# Patient Record
Sex: Male | Born: 1995 | Race: White | Hispanic: No | Marital: Single | Smoking: Current every day smoker
Health system: Southern US, Community
[De-identification: ages and names within clinical notes are randomized; demographics above are authoritative.]

## PROBLEM LIST (undated history)

## (undated) DIAGNOSIS — R636 Underweight: Secondary | ICD-10-CM

## (undated) DIAGNOSIS — F988 Other specified behavioral and emotional disorders with onset usually occurring in childhood and adolescence: Secondary | ICD-10-CM

## (undated) DIAGNOSIS — J309 Allergic rhinitis, unspecified: Secondary | ICD-10-CM

## (undated) DIAGNOSIS — F419 Anxiety disorder, unspecified: Secondary | ICD-10-CM

## (undated) DIAGNOSIS — Z8774 Personal history of (corrected) congenital malformations of heart and circulatory system: Secondary | ICD-10-CM

## (undated) DIAGNOSIS — R51 Headache: Secondary | ICD-10-CM

## (undated) DIAGNOSIS — Z87820 Personal history of traumatic brain injury: Secondary | ICD-10-CM

## (undated) HISTORY — DX: Personal history of (corrected) congenital malformations of heart and circulatory system: Z87.74

## (undated) HISTORY — DX: Allergic rhinitis, unspecified: J30.9

## (undated) HISTORY — DX: Underweight: R63.6

## (undated) HISTORY — PX: OTHER SURGICAL HISTORY: SHX169

## (undated) HISTORY — DX: Headache: R51

## (undated) HISTORY — DX: Other specified behavioral and emotional disorders with onset usually occurring in childhood and adolescence: F98.8

## (undated) HISTORY — DX: Personal history of traumatic brain injury: Z87.820

## (undated) HISTORY — DX: Anxiety disorder, unspecified: F41.9

---
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
## 2017-08-28 ENCOUNTER — Encounter: Payer: Self-pay | Admitting: Family Medicine

## 2017-08-28 ENCOUNTER — Encounter: Payer: Self-pay | Admitting: *Deleted

## 2017-08-28 ENCOUNTER — Ambulatory Visit: Payer: Managed Care, Other (non HMO) | Admitting: Family Medicine

## 2017-08-28 VITALS — BP 100/60 | HR 73 | Temp 98.4°F | Ht 68.0 in | Wt 112.8 lb

## 2017-08-28 DIAGNOSIS — J069 Acute upper respiratory infection, unspecified: Secondary | ICD-10-CM

## 2017-08-28 NOTE — Progress Notes (Signed)
Subjective:    Patient ID: Micheal Huff, male    DOB: 07-21-1995, 22 y.o.   MRN: [id number]  HPI This is a 22 yo male who presents today with cough and nasal congestion x 5 days. Started with sore throat, moved to cough and congestion. Some nasal congestion, no recent fever, no headache or ear pain. Mother sick with similar symptoms about a week ago. No SOB or wheeze. 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 Not taking any medications for symptoms.     Past Medical History:  Diagnosis Date  . ADD (attention deficit disorder)    inattentive  . Allergic rhinitis   . Anxiety   . H/O aortic coarctation repair 1998   mild murmur, cleared for all sports by cards (Dr Sandre Kitty at [place])  . Headache(784.0)   . 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 Jaundice of newborn    s/p light therapy  . 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 double hernia repair  2/99   with circumcision  . Heart coartation repair  11-13-1996   congenital heart disease s/p repair   Family History  Problem Relation Age of Onset  . Alcohol abuse Father   . Drug abuse Father   . Bipolar disorder Father   . Bipolar disorder Maternal Uncle   . Suicidality Maternal Uncle   . Alcohol abuse Maternal Grandfather   . Bipolar disorder Maternal Grandmother   . Alcohol abuse Paternal Grandfather   . Bipolar disorder Paternal Grandmother   . Lupus Maternal Grandfather   . Diabetes Neg Hx   . Coronary artery disease Neg Hx   . Cancer Mother        BCC, maternal side   Social History   Tobacco Use  . Smoking status: Current Every Day Smoker    Types: E-cigarettes  . Smokeless tobacco: Never Used  Substance Use Topics  . Alcohol use: No  . Drug use: No      Review of Systems Per HPI    Objective:   Physical Exam  Constitutional: No distress.    Thin.  HENT:  Head: Normocephalic and atraumatic.  Right Ear: Tympanic membrane, external ear and ear canal normal.  Left Ear: Tympanic membrane, external ear and ear canal normal.  Nose: Nose normal.  Mouth/Throat: Uvula is midline, oropharynx is clear and moist and mucous membranes are normal. No tonsillar exudate.  Eyes: Conjunctivae are normal.  Neck: Normal range of motion. Neck supple.  Cardiovascular: Normal rate, regular rhythm and normal heart sounds.  Pulmonary/Chest: Effort normal and breath sounds normal.  Lymphadenopathy:    He has no cervical adenopathy.  Skin: He is not diaphoretic.  Vitals reviewed.     BP 100/60   Pulse 73   Temp 98.4 F (36.9 C) (Oral)   Ht 5\' 8"  (1.727 m)   Wt 112 lb 12 oz (51.1 kg)   SpO2 97%   BMI 17.14 kg/m  Wt Readings from Last 3 Encounters:  08/28/17 112 lb 12 oz (51.1 kg)  06/11/15 107 lb 8 oz (48.8 kg) (<1 %, Z= -2.45)*  04/19/12 77 lb 12 oz (35.3 kg) (<1 %, Z= -3.16)*   * Growth percentiles are based on CDC (Boys, 2-20 Years) data.       Assessment & Plan:  1. URI with cough and congestion - Provided written and verbal information regarding diagnosis and treatment. - return to work note provided - feeling better, provided information about symptomatic treatment, RTC precautions   Olean Reeeborah Anais Denslow, FNP-BC  [facility]  08/28/2017 11:41 AM

## 2017-08-28 NOTE — Patient Instructions (Signed)
For nasal congestion you can use Afrin nasal spray for 3 days max, Sudafed, saline nasal spray (generic is fine for all). For cough you can try Delsym. Drink enough fluids to make your urine light yellow. For fever/chill/muscle aches you can take over the counter acetaminophen or ibuprofen.  Please come back in if you are not better in 5-7 days or if you develop wheezing, shortness of breath or persistent vomiting.   

## 2017-10-15 ENCOUNTER — Emergency Department
Admission: EM | Admit: 2017-10-15 | Discharge: 2017-10-15 | Disposition: A | Discharge status: Home / Self Care | Payer: Managed Care, Other (non HMO) | Attending: Emergency Medicine | Admitting: Emergency Medicine

## 2017-10-15 ENCOUNTER — Encounter: Payer: Self-pay | Admitting: Emergency Medicine

## 2017-10-15 ENCOUNTER — Emergency Department: Payer: Managed Care, Other (non HMO)

## 2017-10-15 DIAGNOSIS — R1030 Lower abdominal pain, unspecified: Secondary | ICD-10-CM | POA: Insufficient documentation

## 2017-10-15 DIAGNOSIS — F17210 Nicotine dependence, cigarettes, uncomplicated: Secondary | ICD-10-CM | POA: Insufficient documentation

## 2017-10-15 DIAGNOSIS — R1031 Right lower quadrant pain: Secondary | ICD-10-CM | POA: Diagnosis present

## 2017-10-15 DIAGNOSIS — K5901 Slow transit constipation: Secondary | ICD-10-CM | POA: Insufficient documentation

## 2017-10-15 LAB — URINALYSIS, COMPLETE (UACMP) WITH MICROSCOPIC
Bacteria, UA: NONE SEEN
Bilirubin Urine: NEGATIVE
GLUCOSE, UA: NEGATIVE mg/dL
HGB URINE DIPSTICK: NEGATIVE
KETONES UR: NEGATIVE mg/dL
Leukocytes, UA: NEGATIVE
NITRITE: NEGATIVE
PROTEIN: NEGATIVE mg/dL
Specific Gravity, Urine: 1.019 (ref 1.005–1.030)
Squamous Epithelial / LPF: NONE SEEN (ref 0–5)
pH: 6 (ref 5.0–8.0)

## 2017-10-15 LAB — COMPREHENSIVE METABOLIC PANEL
ALK PHOS: 78 U/L (ref 38–126)
ALT: 33 U/L (ref 0–44)
ANION GAP: 8 (ref 5–15)
AST: 25 U/L (ref 15–41)
Albumin: 4.4 g/dL (ref 3.5–5.0)
BILIRUBIN TOTAL: 0.7 mg/dL (ref 0.3–1.2)
BUN: 16 mg/dL (ref 6–20)
CALCIUM: 8.8 mg/dL — AB (ref 8.9–10.3)
CO2: 25 mmol/L (ref 22–32)
Chloride: 104 mmol/L (ref 98–111)
Creatinine, Ser: 0.76 mg/dL (ref 0.61–1.24)
GFR calc non Af Amer: >60 mL/min (ref >60)
Glucose, Bld: 121 mg/dL — ABNORMAL HIGH (ref 70–99)
Potassium: 4.1 mmol/L (ref 3.5–5.1)
SODIUM: 137 mmol/L (ref 135–145)
TOTAL PROTEIN: 7.1 g/dL (ref 6.5–8.1)

## 2017-10-15 LAB — CBC
HCT: 38.9 % — ABNORMAL LOW (ref 40.0–52.0)
HEMOGLOBIN: 13.7 g/dL (ref 13.0–18.0)
MCH: 30.4 pg (ref 26.0–34.0)
MCHC: 35.1 g/dL (ref 32.0–36.0)
MCV: 86.5 fL (ref 80.0–100.0)
Platelets: 246 10*3/uL (ref 150–440)
RBC: 4.5 MIL/uL (ref 4.40–5.90)
RDW: 13.3 % (ref 11.5–14.5)
WBC: 6.8 10*3/uL (ref 3.8–10.6)

## 2017-10-15 LAB — LIPASE, BLOOD: Lipase: 24 U/L (ref 11–51)

## 2017-10-15 MED ORDER — IOPAMIDOL (ISOVUE-300) INJECTION 61%
100.0000 mL | Freq: Once | INTRAVENOUS | Status: AC | PRN
  Administered 2017-10-15: 100 mL via INTRAVENOUS
  Filled 2017-10-15: qty 100

## 2017-10-15 MED ORDER — IOPAMIDOL (ISOVUE-300) INJECTION 61%
15.0000 mL | INTRAVENOUS | Status: AC
  Administered 2017-10-15: 15 mL via ORAL
  Filled 2017-10-15 (×2): qty 15

## 2017-10-15 NOTE — ED Triage Notes (Signed)
First Nurse Note:  C/O RUQ pain x 4 days.  Symptoms accompanied by nausea.    Patient AAOx3.  Skin warm and dry.  Ambulates with easy and steady gait.  Posture upright and relaxed.  NAD

## 2017-10-15 NOTE — ED Provider Notes (Signed)
[facility] Emergency Department Provider Note   ____________________________________________   First MD Initiated Contact with Patient 10/15/17 1800     (approximate)  I have reviewed the triage vital signs and the nursing notes.   HISTORY  Chief Complaint Abdominal Pain    HPI Micheal Huff is a 22 y.o. male patient presents with right lower quadrant pain for 5 days.  Patient state pain increased with movement.  Patient denies nausea or vomiting.  Patient states he was seen by urgent care clinic 2 days ago and was given medicine for constipation.  Patient has small bowel movements since taking medication.  Patient points to the right lower quadrant as a source of pain.  Patient describes the pain as "sharp".  No other palliative measures for this complaint.  Patient appears in no acute distress.  Past Medical History:  Diagnosis Date  . ADD (attention deficit disorder)    inattentive  . Allergic rhinitis   . Anxiety   . H/O aortic coarctation repair 1998   mild murmur, cleared for all sports by cards (Dr Sandre Kitty at [place])  . Headache(784.0)   . 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 Jaundice of newborn    s/p light therapy  . 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 Insomnia 04/20/2012  . Well adolescent visit 11/02/2011  . Allergic rhinitis   . Underweight   . H/O aortic coarctation repair     Past Surgical History:  Procedure Laterality Date  . double hernia repair  2/99   with circumcision  . Heart coartation repair  11/14/96   congenital heart disease s/p repair    Prior to Admission medications   Not on File    Allergies Patient has no known allergies.  Family History  Problem Relation Age of Onset  . Cancer Mother        BCC, maternal side  . Alcohol abuse Father   . Drug abuse Father   . Bipolar disorder  Father   . Alcohol abuse Maternal Grandfather   . Lupus Maternal Grandfather   . Bipolar disorder Maternal Grandmother   . Alcohol abuse Paternal Grandfather   . Bipolar disorder Paternal Grandmother   . Bipolar disorder Maternal Uncle   . Suicidality Maternal Uncle   . Diabetes Neg Hx   . Coronary artery disease Neg Hx     Social History Social History   Tobacco Use  . Smoking status: Current Every Day Smoker    Types: E-cigarettes  . Smokeless tobacco: Never Used  Substance Use Topics  . Alcohol use: No  . Drug use: No    Review of Systems  Constitutional: No fever/chills Eyes: No visual changes. ENT: No sore throat. Cardiovascular: Denies chest pain. Respiratory: Denies shortness of breath. Gastrointestinal: Right lower abdominal pain.  No nausea, no vomiting.  No diarrhea.  No constipation. Genitourinary: Negative for dysuria. Musculoskeletal: Negative for back pain. Skin: Negative for rash. Neurological: Negative for headaches, focal weakness or numbness.   ____________________________________________   PHYSICAL EXAM:  VITAL SIGNS: ED Triage Vitals  Enc Vitals Group     BP 10/15/17 1634 (!) 117/110     Pulse Rate 10/15/17 1634 (!) 54     Resp 10/15/17 1631 16     Temp 10/15/17 1631 (!) 97.5 F (36.4 C)  Temp Source 10/15/17 1631 Oral     SpO2 10/15/17 1634 100 %     Weight 10/15/17 1635 115 lb (52.2 kg)     Height 10/15/17 1635 5\' 7"  (1.702 m)     Head Circumference --      Peak Flow --      Pain Score 10/15/17 1635 6     Pain Loc --      Pain Edu? --      Excl. in GC? --    Constitutional: Alert and oriented. Well appearing and in no acute distress. Cardiovascular: Bradycardic, regular rhythm. Grossly normal heart sounds.  Good peripheral circulation. Respiratory: Normal respiratory effort.  No retractions. Lungs CTAB. Gastrointestinal: No obvious distention.  Decreased bowel sounds.  Moderate guarding palpation right lower quadrant.   Genitourinary: Deferred Neurologic:  Normal speech and language. No gross focal neurologic deficits are appreciated. No gait instability. Skin:  Skin is warm, dry and intact. No rash noted. Psychiatric: Mood and affect are normal. Speech and behavior are normal.  ____________________________________________   LABS (all labs ordered are listed, but only abnormal results are displayed)  Labs Reviewed  COMPREHENSIVE METABOLIC PANEL - Abnormal; Notable for the following components:      Result Value   Glucose, Bld 121 (*)    Calcium 8.8 (*)    All other components within normal limits  CBC - Abnormal; Notable for the following components:   HCT 38.9 (*)    All other components within normal limits  URINALYSIS, COMPLETE (UACMP) WITH MICROSCOPIC - Abnormal; Notable for the following components:   Color, Urine YELLOW (*)    APPearance CLEAR (*)    All other components within normal limits  LIPASE, BLOOD   ____________________________________________  EKG   ____________________________________________  RADIOLOGY  ED MD interpretation:    Official radiology report(s): Ct Abdomen Pelvis W Contrast  Result Date: 10/15/2017 CLINICAL DATA:  Right lower quadrant pain, nausea, vomiting EXAM: CT ABDOMEN AND PELVIS WITH CONTRAST TECHNIQUE: Multidetector CT imaging of the abdomen and pelvis was performed using the standard protocol following bolus administration of intravenous contrast. CONTRAST:  ISOVUE-300 IOPAMIDOL (ISOVUE-300) INJECTION 61% COMPARISON:  None. FINDINGS: Lower chest: Lung bases are clear. No effusions. Heart is normal size. Hepatobiliary: No focal hepatic abnormality. Gallbladder unremarkable. Pancreas: No focal abnormality or ductal dilatation. Spleen: No focal abnormality.  Normal size. Adrenals/Urinary Tract: No adrenal abnormality. No focal renal abnormality. No stones or hydronephrosis. Urinary bladder is unremarkable. Stomach/Bowel: The appendix is not visualized.  No pericecal inflammatory process or suspicious ancillary findings. Moderate stool in the colon. Stomach, large and small bowel grossly unremarkable. Vascular/Lymphatic: No evidence of aneurysm or adenopathy. Reproductive: No visible focal abnormality. Other: No free fluid or free air. Musculoskeletal: No acute bony abnormality. IMPRESSION: Nonvisualization of the appendix. However, there is no Peri cecal inflammatory process to suggest appendicitis. No acute findings in the abdomen or pelvis. Electronically Signed   By: Charlett Nose M.D.   On: 10/15/2017 20:50    ____________________________________________   PROCEDURES  Procedure(s) performed: None  Procedures  Critical Care performed: No  ____________________________________________   INITIAL IMPRESSION / ASSESSMENT AND PLAN / ED COURSE  As part of my medical decision making, I reviewed the following data within the electronic MEDICAL RECORD NUMBER    Right lower quadrant abdominal pain.  Differential consist of appendicitis,  Peritonitis, or constipation..  Discussed CT findings with patient showing no inflammatory process.  Patient given discharge care instruction.  Patient advised continue taking  medication for constipation.  Patient advised follow-up PCP.      ____________________________________________   FINAL CLINICAL IMPRESSION(S) / ED DIAGNOSES  Final diagnoses:  Lower abdominal pain  Slow transit constipation     ED Discharge Orders    None       Note:  This document was prepared using Dragon voice recognition software and may include unintentional dictation errors.    Joni ReiningSmith, Ronald K, PA-C 10/15/17 2123    Loleta RoseForbach, Cory, MD 10/15/17 [phone]

## 2017-10-15 NOTE — ED Triage Notes (Signed)
Patient presents to the ED with right lower quadrant pain x 5 days.  Worse with certain movements.  Patient reports some nausea, denies vomiting.  Patient states he went to urgent care and they gave him medication for constipation.  Patient states, "after that, I've been going to the bathroom, just not that much."  Patient is in no obvious distress at this time.

## 2017-10-15 NOTE — ED Notes (Signed)
Pt to CT via wheelchair

## 2017-10-15 NOTE — ED Notes (Signed)
Pt sipping on po contrast. 

## 2017-10-15 NOTE — ED Notes (Signed)
Pt returned from ct

## 2017-10-15 NOTE — ED Notes (Signed)
Attempted IV access x 1 by this RN, and x 1 by Solect[employer] Corporationmber RN, both unsuccessful attempts.

## 2017-10-15 NOTE — Discharge Instructions (Addendum)
Continue previous medication 

## 2018-06-06 ENCOUNTER — Telehealth: Payer: Self-pay

## 2018-06-06 NOTE — Telephone Encounter (Signed)
Pt left /vm requesting cb about respiratory symptoms.I spoke with pt. Pt started with symptoms on 06/01/18. Pt felt warm and did not ck temp because had no thermometer. Prod cough with yellow phlegm. No SOB. Pt has not done any traveling and no exposure to known corona virus or flu individuals.pt was seen at [facility] on 06/05/18 and was given benzonatate for cough and OTC plain mucinex and fluticasone nasal spray. Pt said he thinks it is too early to determine if meds are helping or not. 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 Pt was advised at this time with the symptoms given that he is at low risk for covid 19.Pt request this note sent to [facility] for review and if Jae Dire thinks pt needs to do anything different to call pt back otherwise does not need to call pt. Pt will cb if condition changes or worsens.

## 2018-06-06 NOTE — Telephone Encounter (Signed)
Noted, excellent note and advice! Agree that patient is low risk and should follow recommendations from Urgent Care. Update if needed.

## 2019-07-01 ENCOUNTER — Other Ambulatory Visit: Payer: Self-pay

## 2019-07-01 ENCOUNTER — Ambulatory Visit (INDEPENDENT_AMBULATORY_CARE_PROVIDER_SITE_OTHER)
Admission: RE | Admit: 2019-07-01 | Discharge: 2019-07-01 | Disposition: A | Discharge status: Home / Self Care | Payer: BC Managed Care – PPO | Source: Ambulatory Visit | Attending: Internal Medicine | Admitting: Internal Medicine

## 2019-07-01 ENCOUNTER — Ambulatory Visit: Payer: BC Managed Care – PPO | Admitting: Internal Medicine

## 2019-07-01 ENCOUNTER — Encounter: Payer: Self-pay | Admitting: Internal Medicine

## 2019-07-01 VITALS — BP 106/64 | HR 66 | Temp 98.4°F | Wt 130.0 lb

## 2019-07-01 DIAGNOSIS — K5901 Slow transit constipation: Secondary | ICD-10-CM

## 2019-07-01 NOTE — Progress Notes (Signed)
Subjective:    Patient ID: Micheal Huff, male    DOB: 02/06/1996, 24 y.o.   MRN: [id number]  HPI  Pt presents to the clinic today with c/o constipation. He reports this started a few weeks ago but he has not had a BM in the last 4 days. He denies abdominal pain or cramping, but has had some bloating. He denies nausea, vomiting, diarrhea or blood in his stool. He is passing gas. He has tried Mag Citrate, Mirilax and Fiber with minimal relief. He does not drink a lot of water. He is concerned he may have a blockage.   Review of Systems      Past Medical History:  Diagnosis Date  . ADD (attention deficit disorder)    inattentive  . Allergic rhinitis   . Anxiety   . H/O aortic coarctation repair 1998   mild murmur, cleared for all sports by cards (Dr Rebeca Alert at [place])  . Headache(784.0)   . 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 Jaundice of newborn    s/p light therapy  . 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   No current facility-administered medications for this visit.    No Known Allergies  Family History  Problem Relation Age of Onset  . Cancer Mother        BCC, maternal side  . Alcohol abuse Father   . Drug abuse Father   . Bipolar disorder Father   . Alcohol abuse Maternal Grandfather   . Lupus Maternal Grandfather   . Bipolar disorder Maternal Grandmother   . Alcohol abuse Paternal Grandfather   . Bipolar disorder Paternal Grandmother   . Bipolar disorder Maternal Uncle   . Suicidality Maternal Uncle   . Diabetes Neg Hx   . Coronary artery disease Neg Hx     Social History   Socioeconomic History  . Marital status: Single    Spouse name: Not on file  . Number of children: Not on file  . Years of education: Not on file  . Highest education level: Not on file  Occupational History  . Not on file  Tobacco Use  . Smoking status: Current Every  Day Smoker    Types: E-cigarettes  . Smokeless tobacco: Never Used  Substance and Sexual Activity  . Alcohol use: No  . Drug use: No  . Sexual activity: Never  Other Topics Concern  . Not on file  Social History Narrative   Single. Sexually active.   Has twin brother   Parents divorced (father with h/o substance/EtOH abuse)   Activity: likes to play outside - with dogs and fishing   Social Determinants of Radio broadcast assistant Strain:   . Difficulty of Paying Living Expenses:   Food Insecurity:   . Worried About Charity fundraiser in the Last Year:   . Arboriculturist in the Last Year:   Transportation Needs:   . Film/video editor (Medical):   Marland Kitchen Lack of Transportation (Non-Medical):   Physical Activity:   . Days of Exercise per Week:   . Minutes of Exercise per Session:   Stress:   . Feeling of Stress :   Social Connections:   . Frequency of Communication with Friends and Family:   . Frequency of Social Gatherings with Friends and Family:   . Attends Religious Services:   .  Active Member of Clubs or Organizations:   . Attends Banker Meetings:   Marland Kitchen Marital Status:   Intimate Partner Violence:   . Fear of Current or Ex-Partner:   . Emotionally Abused:   Marland Kitchen Physically Abused:   . Sexually Abused:      Constitutional: Denies fever, malaise, fatigue, headache or abrupt weight changes.  Respiratory: Denies difficulty breathing, shortness of breath, cough or sputum production.   Cardiovascular: Denies chest pain, chest tightness, palpitations or swelling in the hands or feet.  Gastrointestinal: Pt reports constipation. Denies abdominal pain, bloating, diarrhea or blood in the stool.   No other specific complaints in a complete review of systems (except as listed in HPI above).  Objective:   Physical Exam  BP 106/64   Pulse 66   Temp 98.4 F (36.9 C) (Temporal)   Wt 130 lb (59 kg)   SpO2 98%   BMI 20.36 kg/m   Wt Readings from Last 3  Encounters:  10/15/17 115 lb (52.2 kg)  08/28/17 112 lb 12 oz (51.1 kg)  06/11/15 107 lb 8 oz (48.8 kg) (<1 %, Z= -2.45)*   * Growth percentiles are based on CDC (Boys, 2-20 Years) data.    General: Appears his stated age, well developed, well nourished in NAD. Cardiovascular: Normal rate and rhythm. S1,S2 noted.  No murmur, rubs or gallops noted.  Pulmonary/Chest: Normal effort and positive vesicular breath sounds. No respiratory distress. No wheezes, rales or ronchi noted.  Abdomen: Soft, mildly tender in the RLQ. Normal bowel sounds. No distention or masses noted. Liver, spleen and kidneys non palpable. Neurological: Alert and oriented.   BMET    Component Value Date/Time   NA 137 10/15/2017 1639   K 4.1 10/15/2017 1639   CL 104 10/15/2017 1639   CO2 25 10/15/2017 1639   GLUCOSE 121 (H) 10/15/2017 1639   BUN 16 10/15/2017 1639   CREATININE 0.76 10/15/2017 1639   CALCIUM 8.8 (L) 10/15/2017 1639   GFRNONAA >60 10/15/2017 1639   GFRAA >60 10/15/2017 1639    Lipid Panel  No results found for: CHOL, TRIG, HDL, CHOLHDL, VLDL, LDLCALC  CBC    Component Value Date/Time   WBC 6.8 10/15/2017 1639   RBC 4.50 10/15/2017 1639   HGB 13.7 10/15/2017 1639   HCT 38.9 (L) 10/15/2017 1639   PLT 246 10/15/2017 1639   MCV 86.5 10/15/2017 1639   MCH 30.4 10/15/2017 1639   MCHC 35.1 10/15/2017 1639   RDW 13.3 10/15/2017 1639    Hgb A1C No results found for: HGBA1C         Assessment & Plan:   Constipation:  Increase fiber Consume adequate water intake at least 48 preferable 64 oz daily KUB today Start Mirilax daily  Will follow up after xray, return precautions discussed Nicki Reaper, NP This visit occurred during the SARS-CoV-2 public health emergency.  Safety protocols were in place, including screening questions prior to the visit, additional usage of staff PPE, and extensive cleaning of exam room while observing appropriate contact time as indicated for disinfecting  solutions.

## 2019-07-01 NOTE — Patient Instructions (Signed)

## 2019-07-17 ENCOUNTER — Ambulatory Visit: Payer: BC Managed Care – PPO | Admitting: Family Medicine

## 2020-10-19 ENCOUNTER — Telehealth: Payer: Self-pay

## 2020-10-19 NOTE — Telephone Encounter (Signed)
Reviewing patient list has not been seen by you in while. Last visit with you 06/11/2015. He did have acute visit with other providers in our office on 08/28/2017 and 07/01/2019. Ok to call and make Cpe appointment? Or does he need find new PCP

## 2020-10-20 NOTE — Telephone Encounter (Signed)
Please call and make appointment for Cpe

## 2020-10-20 NOTE — Telephone Encounter (Signed)
Okay to schedule CPE.

## 2020-12-01 ENCOUNTER — Encounter: Payer: BC Managed Care – PPO | Admitting: Primary Care

## 2021-06-23 IMAGING — DX DG ABDOMEN 1V
2 series · 2 of 2 positions shown · non-contrast
Comparison: CT 10/15/2017.

CLINICAL DATA: Constipation.

EXAM:
ABDOMEN - 1 VIEW

[abdomen kub (1 of 2)]
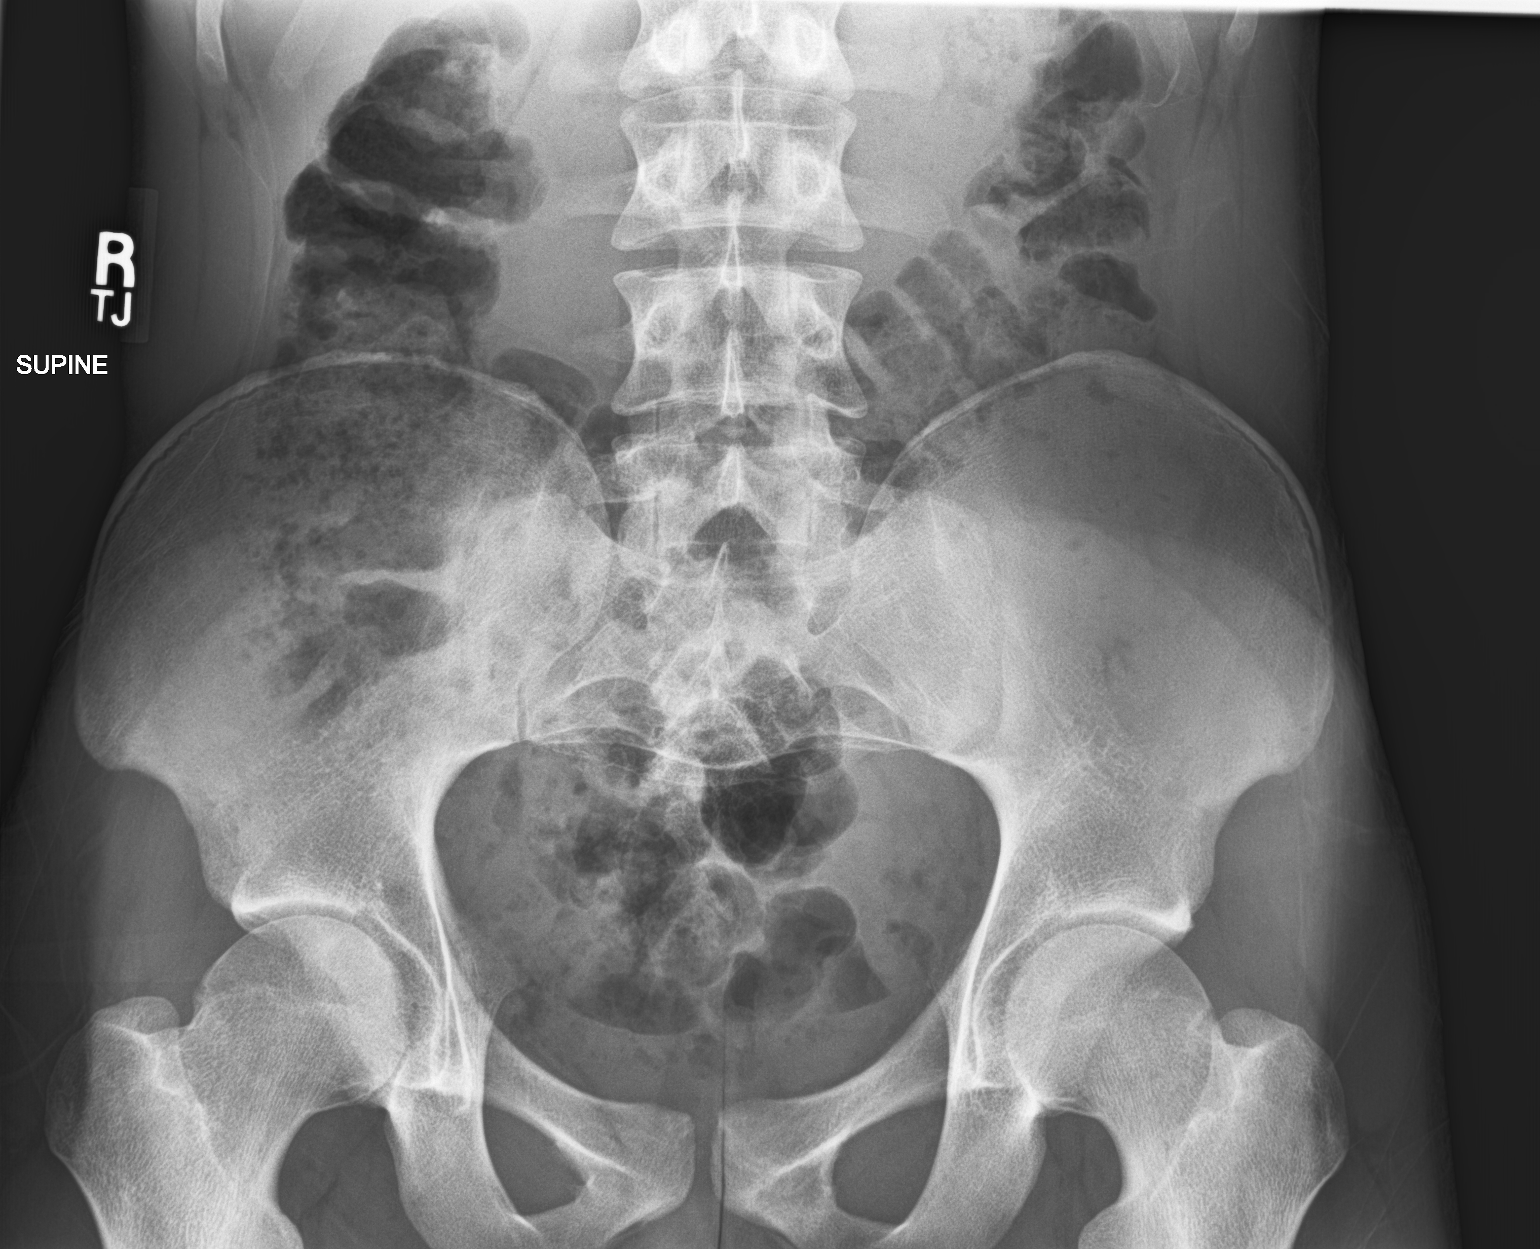

[abdomen kub (2 of 2)]
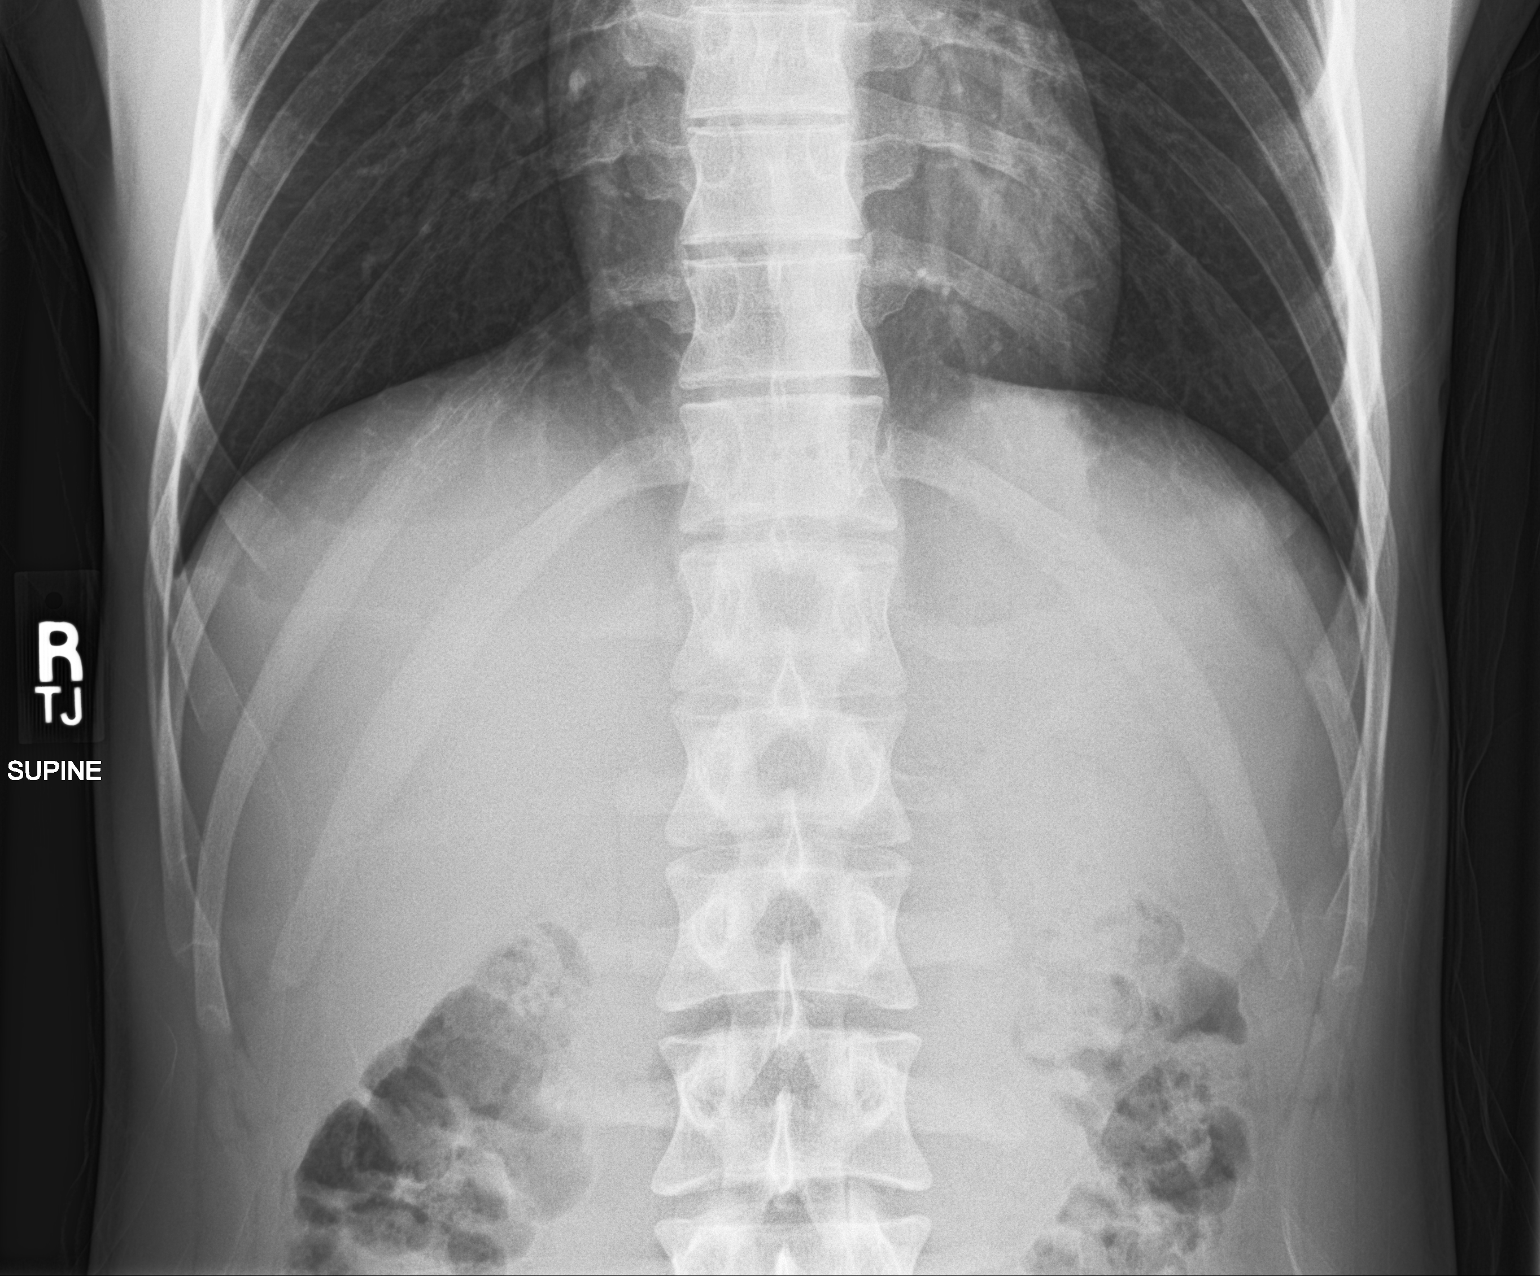

[2 of 2 positions shown; findings below may reference images not displayed]

FINDINGS: Soft tissue structures are unremarkable. Moderate stool volume. No
bowel distention. No free air. No acute bony abnormality identified.
IMPRESSION: Moderate stool volume.  None.  No acute abnormality identified.
